# Patient Record
Sex: Female | Born: 2000 | Race: Black or African American | Hispanic: No | Marital: Single | Smoking: Never smoker
Health system: Southern US, Community
[De-identification: ages and names within clinical notes are randomized; demographics above are authoritative.]

---
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
## 2020-05-22 IMAGING — CT CT ABD-PELV W/ CM
2 of 4 series · 15 of 46 positions shown, 17 images · IV contrast (ISOVUE)
Comparison: None.

CLINICAL DATA: Abdominal pain. Left lower quadrant pain and
diarrhea.

EXAM:
CT ABDOMEN AND PELVIS WITH CONTRAST
TECHNIQUE: Multidetector CT imaging of the abdomen and pelvis was performed
using the standard protocol following bolus administration of
intravenous contrast.
CONTRAST:  80mL WVTY23-PDD IOPAMIDOL (WVTY23-PDD) INJECTION 61%

[Series 2: abd/pelvis st · axial · 0.64mm/px · z∈[-334,+6]mm · 12 of 82 slices shown, 14 images]
[im 7/82  soft-tissue]
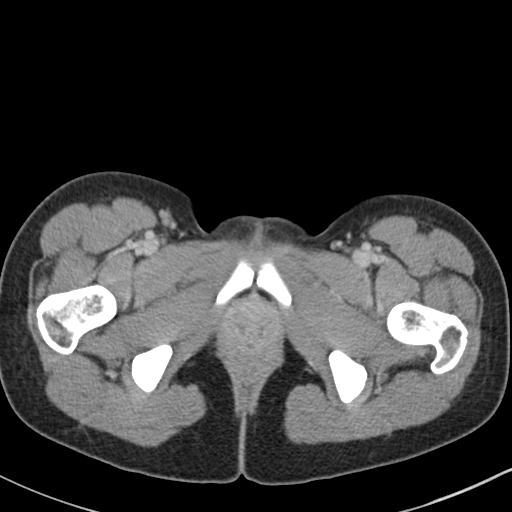
[im 7/82  bone]
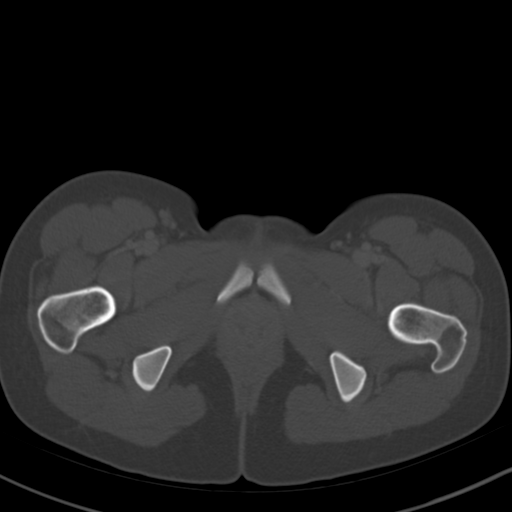
[im 13/82  soft-tissue]
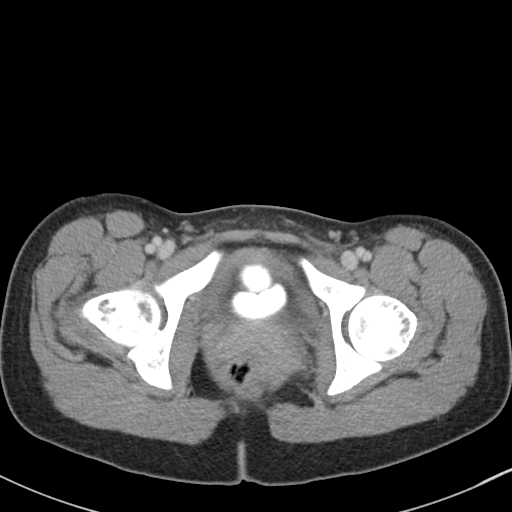
[im 19/82  soft-tissue]
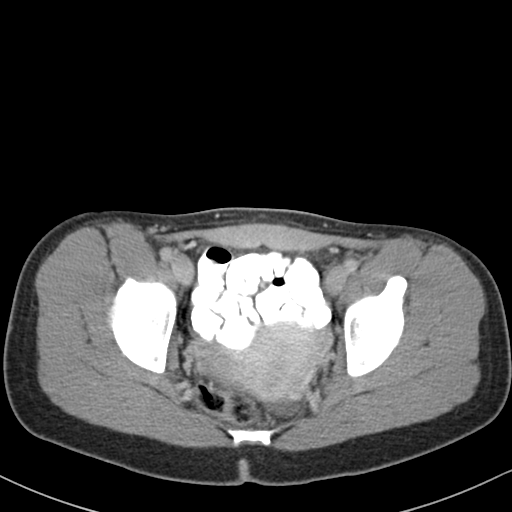
[im 25/82  soft-tissue]
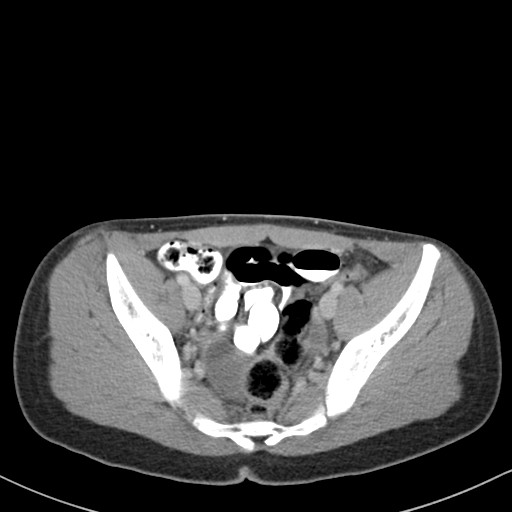
[im 32/82  soft-tissue]
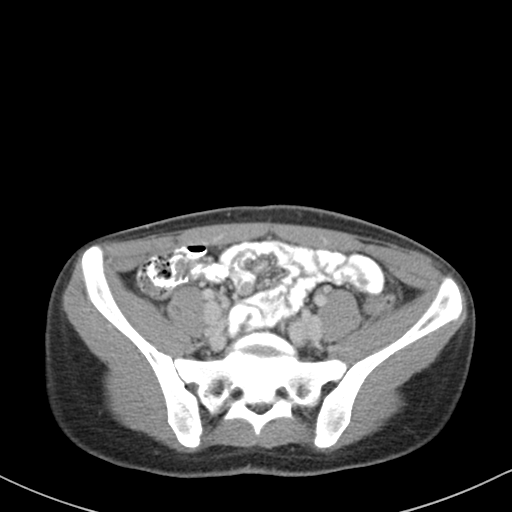
[im 38/82  soft-tissue]
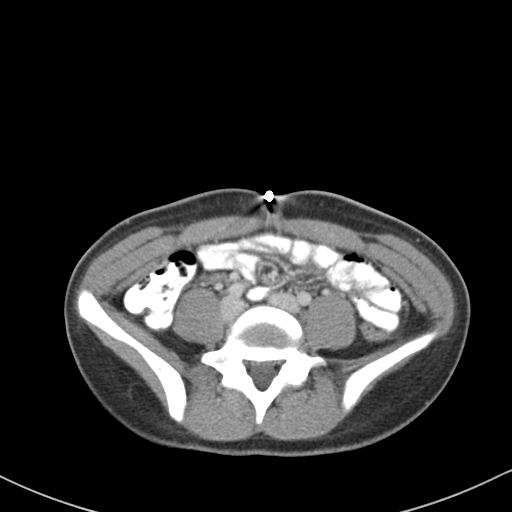
[im 44/82  soft-tissue]
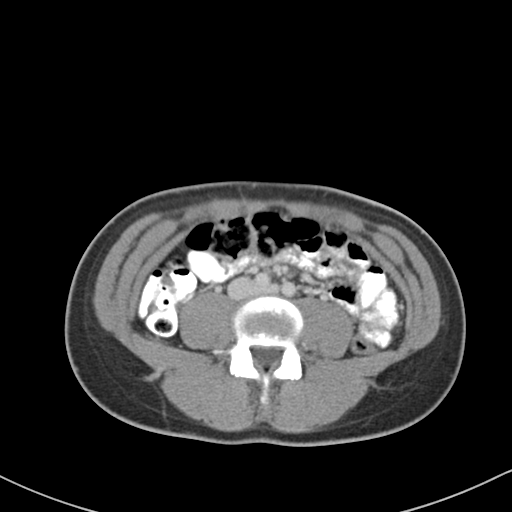
[im 50/82  soft-tissue]
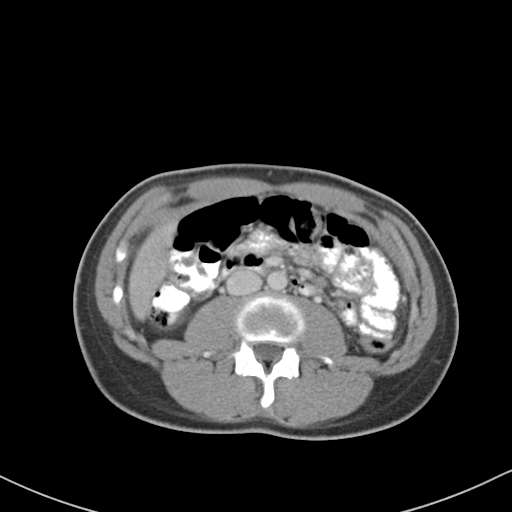
[im 57/82  soft-tissue]
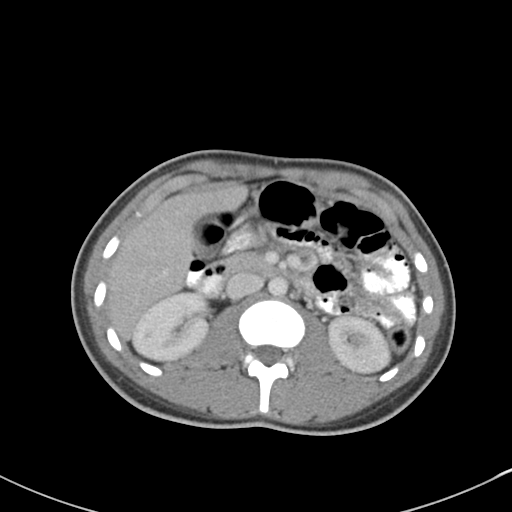
[im 57/82  bone]
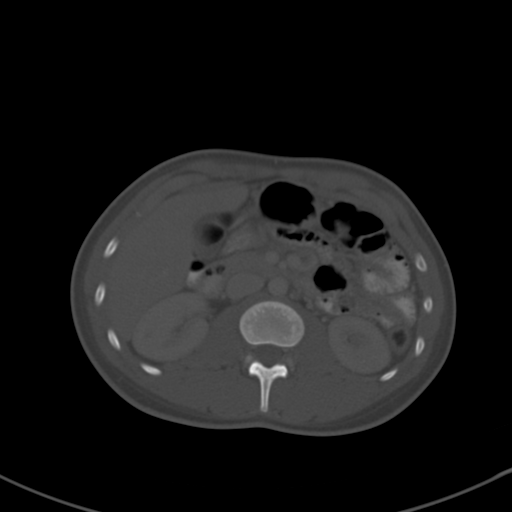
[im 63/82  soft-tissue]
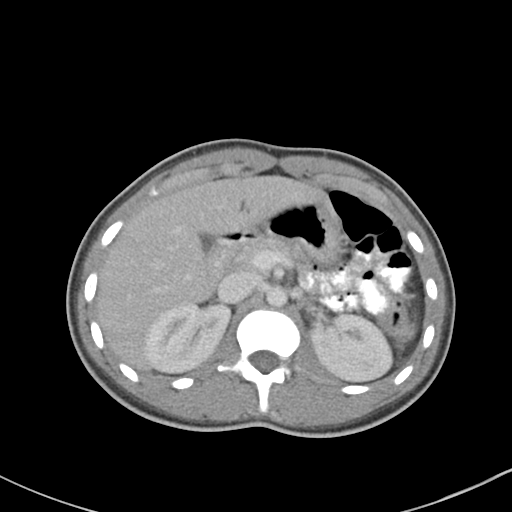
[im 69/82  soft-tissue]
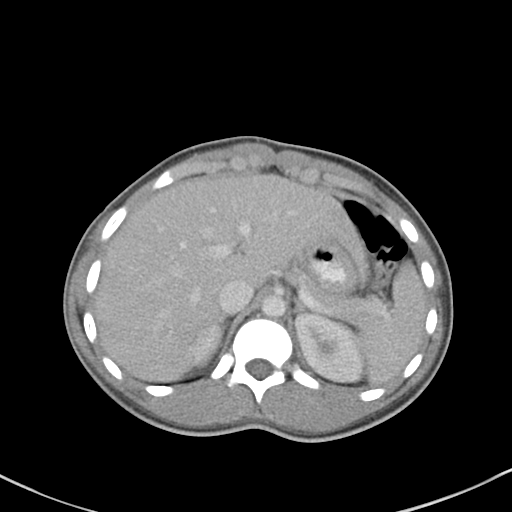
[im 75/82  soft-tissue]
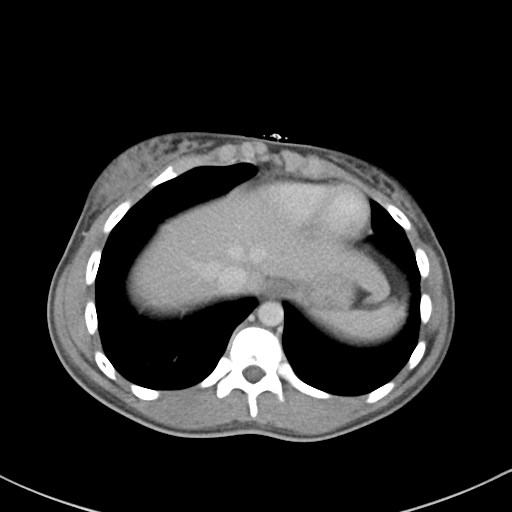

[Series 4: coronal images · coronal · 0.55mm/px · 3 of 107 slices shown]
[im 36/107  soft-tissue]
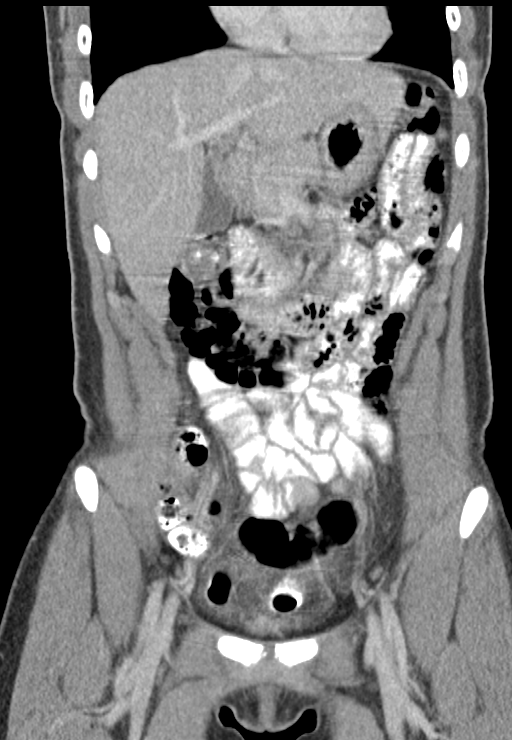
[im 48/107  soft-tissue]
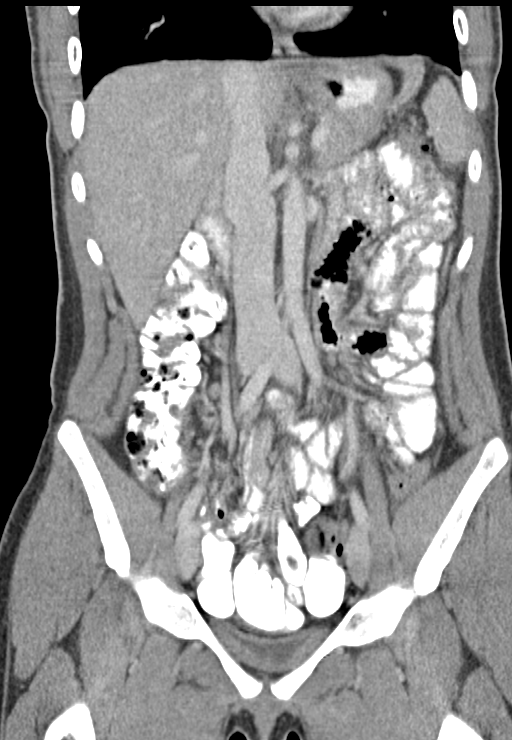
[im 59/107  soft-tissue]
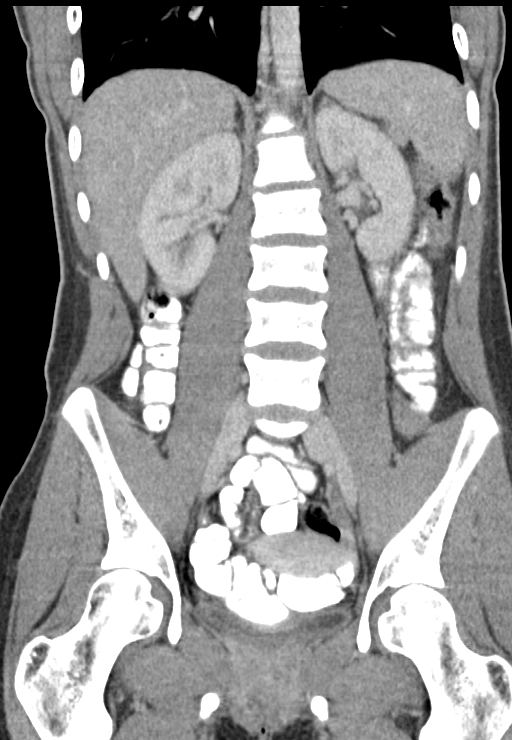

[15 of 46 positions shown; findings below may reference images not displayed]

FINDINGS: Lower chest: The lung bases are clear.

Hepatobiliary: No focal liver abnormality is seen. No gallstones,
gallbladder wall thickening, or biliary dilatation.

Pancreas: Unremarkable. No pancreatic ductal dilatation or
surrounding inflammatory changes.

Spleen: Normal in size without focal abnormality.

Adrenals/Urinary Tract: Adrenal glands are unremarkable. Kidneys are
normal, without renal calculi, focal lesion, or hydronephrosis.
Bladder is completely empty but grossly unremarkable.

Stomach/Bowel: Stomach is within normal limits. Appendix appears
normal. No evidence of small bowel wall thickening, distention, or
inflammatory changes. Descending and sigmoid colon are nondistended
limiting assessment, no significant pericolonic edema

Vascular/Lymphatic: No significant vascular findings are present. No
enlarged abdominal or pelvic lymph nodes.

Reproductive: A 3.5 cm cyst in the right ovary is likely
physiologic. Left ovary is normal. No adnexal mass. Uterus is
unremarkable. Trace free fluid in the pelvis is likely physiologic.

Other: No free air or intra-abdominal abscess.

Musculoskeletal: There are no acute or suspicious osseous
abnormalities.
IMPRESSION: 1. Descending and sigmoid colon are collapsed limiting assessment in
the setting of left lower quadrant pain and diarrhea. Mild colitis
is not entirely excluded, but no significant pericolonic
inflammatory change.
2. A 3.5 cm cyst in the right ovary is likely physiologic in a
patient of this age. No dedicated imaging follow-up is needed.

## 2022-08-02 ENCOUNTER — Ambulatory Visit (HOSPITAL_BASED_OUTPATIENT_CLINIC_OR_DEPARTMENT_OTHER): Payer: Self-pay | Admitting: Family Medicine
